# Patient Record
Sex: Female | Born: 1935 | Hispanic: No | Smoking: Never smoker
Health system: Southern US, Community
[De-identification: ages and names within clinical notes are randomized; demographics above are authoritative.]

## PROBLEM LIST (undated history)

## (undated) DIAGNOSIS — N183 Chronic kidney disease, stage 3 unspecified: Secondary | ICD-10-CM

## (undated) DIAGNOSIS — M8580 Other specified disorders of bone density and structure, unspecified site: Secondary | ICD-10-CM

## (undated) DIAGNOSIS — I10 Essential (primary) hypertension: Secondary | ICD-10-CM

## (undated) DIAGNOSIS — Z8673 Personal history of transient ischemic attack (TIA), and cerebral infarction without residual deficits: Secondary | ICD-10-CM

## (undated) DIAGNOSIS — G4700 Insomnia, unspecified: Secondary | ICD-10-CM

## (undated) DIAGNOSIS — E7800 Pure hypercholesterolemia, unspecified: Secondary | ICD-10-CM

## (undated) HISTORY — DX: Chronic kidney disease, stage 3 (moderate): N18.3

## (undated) HISTORY — DX: Insomnia, unspecified: G47.00

## (undated) HISTORY — DX: Personal history of transient ischemic attack (TIA), and cerebral infarction without residual deficits: Z86.73

## (undated) HISTORY — DX: Pure hypercholesterolemia, unspecified: E78.00

## (undated) HISTORY — DX: Other specified disorders of bone density and structure, unspecified site: M85.80

## (undated) HISTORY — DX: Chronic kidney disease, stage 3 unspecified: N18.30

## (undated) HISTORY — DX: Essential (primary) hypertension: I10

---

## 1999-04-20 ENCOUNTER — Other Ambulatory Visit: Admission: RE | Admit: 1999-04-20 | Discharge: 1999-04-20 | Payer: Self-pay | Admitting: Family Medicine

## 2001-06-09 ENCOUNTER — Other Ambulatory Visit: Admission: RE | Admit: 2001-06-09 | Discharge: 2001-06-09 | Payer: Self-pay | Admitting: Family Medicine

## 2003-06-15 ENCOUNTER — Other Ambulatory Visit: Admission: RE | Admit: 2003-06-15 | Discharge: 2003-06-15 | Payer: Self-pay | Admitting: Family Medicine

## 2004-03-16 ENCOUNTER — Ambulatory Visit: Payer: Self-pay | Admitting: Gastroenterology

## 2004-07-24 ENCOUNTER — Ambulatory Visit: Payer: Self-pay | Admitting: Family Medicine

## 2005-04-21 ENCOUNTER — Emergency Department: Payer: Self-pay | Admitting: Emergency Medicine

## 2005-05-19 ENCOUNTER — Emergency Department: Payer: Self-pay | Admitting: Unknown Physician Specialty

## 2005-05-21 ENCOUNTER — Ambulatory Visit: Payer: Self-pay | Admitting: Unknown Physician Specialty

## 2005-07-16 ENCOUNTER — Ambulatory Visit: Payer: Self-pay | Admitting: Gastroenterology

## 2005-07-26 ENCOUNTER — Ambulatory Visit: Payer: Self-pay | Admitting: Family Medicine

## 2006-08-14 ENCOUNTER — Ambulatory Visit: Payer: Self-pay | Admitting: Family Medicine

## 2006-11-24 ENCOUNTER — Ambulatory Visit: Payer: Self-pay | Admitting: Family Medicine

## 2007-12-09 ENCOUNTER — Other Ambulatory Visit: Payer: Self-pay

## 2007-12-10 ENCOUNTER — Inpatient Hospital Stay: Payer: Self-pay | Admitting: Internal Medicine

## 2008-01-12 ENCOUNTER — Ambulatory Visit: Payer: Self-pay | Admitting: Family Medicine

## 2008-02-03 ENCOUNTER — Observation Stay: Payer: Self-pay | Admitting: Specialist

## 2010-12-05 ENCOUNTER — Inpatient Hospital Stay: Payer: Self-pay | Admitting: Internal Medicine

## 2011-09-09 ENCOUNTER — Ambulatory Visit: Payer: Self-pay | Admitting: Specialist

## 2012-10-17 ENCOUNTER — Observation Stay: Payer: Self-pay | Admitting: Specialist

## 2012-10-17 LAB — COMPREHENSIVE METABOLIC PANEL
Albumin: 2.7 g/dL — ABNORMAL LOW (ref 3.4–5.0)
Anion Gap: 4 — ABNORMAL LOW (ref 7–16)
BUN: 15 mg/dL (ref 7–18)
Bilirubin,Total: 0.4 mg/dL (ref 0.2–1.0)
Calcium, Total: 8.9 mg/dL (ref 8.5–10.1)
Glucose: 84 mg/dL (ref 65–99)
Osmolality: 279 (ref 275–301)
Potassium: 3.9 mmol/L (ref 3.5–5.1)
SGOT(AST): 16 U/L (ref 15–37)
SGPT (ALT): 11 U/L — ABNORMAL LOW (ref 12–78)
Sodium: 140 mmol/L (ref 136–145)
Total Protein: 6.3 g/dL — ABNORMAL LOW (ref 6.4–8.2)

## 2012-10-17 LAB — URINALYSIS, COMPLETE
Bilirubin,UR: NEGATIVE
Blood: NEGATIVE
Ketone: NEGATIVE
Leukocyte Esterase: NEGATIVE
Nitrite: NEGATIVE
RBC,UR: 1 /HPF (ref 0–5)
WBC UR: <1 /HPF (ref 0–5)

## 2012-10-17 LAB — CBC WITH DIFFERENTIAL/PLATELET
Basophil %: 0.6 %
Eosinophil #: 0.2 10*3/uL (ref 0.0–0.7)
HCT: 31.7 % — ABNORMAL LOW (ref 35.0–47.0)
HGB: 10.3 g/dL — ABNORMAL LOW (ref 12.0–16.0)
Lymphocyte %: 9.2 %
MCHC: 32.3 g/dL (ref 32.0–36.0)
MCV: 87 fL (ref 80–100)
Neutrophil %: 84.5 %
Platelet: 333 10*3/uL (ref 150–440)
RDW: 15.7 % — ABNORMAL HIGH (ref 11.5–14.5)
WBC: 10.2 10*3/uL (ref 3.6–11.0)

## 2013-01-01 ENCOUNTER — Inpatient Hospital Stay: Payer: Self-pay | Admitting: Internal Medicine

## 2013-01-01 LAB — URINALYSIS, COMPLETE
Bacteria: NONE SEEN
Bilirubin,UR: NEGATIVE
Blood: NEGATIVE
Ketone: NEGATIVE
Nitrite: NEGATIVE
Ph: 7 (ref 4.5–8.0)
Protein: NEGATIVE
Specific Gravity: 1.013 (ref 1.003–1.030)
WBC UR: <1 /HPF (ref 0–5)

## 2013-01-01 LAB — CBC WITH DIFFERENTIAL/PLATELET
Basophil #: 0.1 10*3/uL (ref 0.0–0.1)
HCT: 39.9 % (ref 35.0–47.0)
HGB: 13.4 g/dL (ref 12.0–16.0)
Lymphocyte %: 12.3 %
MCH: 28.3 pg (ref 26.0–34.0)
MCHC: 33.6 g/dL (ref 32.0–36.0)
MCV: 84 fL (ref 80–100)
Monocyte #: 0.4 x10 3/mm (ref 0.2–0.9)
Monocyte %: 3.5 %
Neutrophil %: 82.3 %
Platelet: 338 10*3/uL (ref 150–440)
WBC: 10.6 10*3/uL (ref 3.6–11.0)

## 2013-01-01 LAB — BASIC METABOLIC PANEL
Anion Gap: 4 — ABNORMAL LOW (ref 7–16)
BUN: 17 mg/dL (ref 7–18)
Chloride: 104 mmol/L (ref 98–107)
Co2: 30 mmol/L (ref 21–32)
Potassium: 3.9 mmol/L (ref 3.5–5.1)
Sodium: 138 mmol/L (ref 136–145)

## 2013-01-02 LAB — BASIC METABOLIC PANEL
Anion Gap: 5 — ABNORMAL LOW (ref 7–16)
BUN: 10 mg/dL (ref 7–18)
Chloride: 110 mmol/L — ABNORMAL HIGH (ref 98–107)
Creatinine: 0.64 mg/dL (ref 0.60–1.30)
EGFR (African American): >60
EGFR (Non-African Amer.): >60
Osmolality: 281 (ref 275–301)
Sodium: 142 mmol/L (ref 136–145)

## 2013-01-02 LAB — CBC WITH DIFFERENTIAL/PLATELET
Basophil %: 0.8 %
HGB: 11.1 g/dL — ABNORMAL LOW (ref 12.0–16.0)
Lymphocyte #: 1.2 10*3/uL (ref 1.0–3.6)
Lymphocyte %: 15.4 %
MCHC: 34 g/dL (ref 32.0–36.0)
MCV: 83 fL (ref 80–100)
Neutrophil #: 5.8 10*3/uL (ref 1.4–6.5)
Platelet: 260 10*3/uL (ref 150–440)
RBC: 3.91 10*6/uL (ref 3.80–5.20)

## 2013-01-02 LAB — CLOSTRIDIUM DIFFICILE BY PCR

## 2013-01-06 LAB — CULTURE, BLOOD (SINGLE)

## 2013-05-29 LAB — CBC
HCT: 38.6 % (ref 35.0–47.0)
HGB: 12.4 g/dL (ref 12.0–16.0)
MCH: 28.6 pg (ref 26.0–34.0)
MCHC: 32 g/dL (ref 32.0–36.0)
MCV: 89 fL (ref 80–100)
PLATELETS: 340 10*3/uL (ref 150–440)
RBC: 4.32 10*6/uL (ref 3.80–5.20)
RDW: 13.7 % (ref 11.5–14.5)
WBC: 10 10*3/uL (ref 3.6–11.0)

## 2013-05-29 LAB — URINALYSIS, COMPLETE
BILIRUBIN, UR: NEGATIVE
BLOOD: NEGATIVE
Bacteria: NONE SEEN
Hyaline Cast: 1
KETONE: NEGATIVE
LEUKOCYTE ESTERASE: NEGATIVE
NITRITE: NEGATIVE
PH: 5 (ref 4.5–8.0)
Protein: NEGATIVE
SPECIFIC GRAVITY: 1.021 (ref 1.003–1.030)
Squamous Epithelial: NONE SEEN

## 2013-05-29 LAB — COMPREHENSIVE METABOLIC PANEL
ALBUMIN: 3.3 g/dL — AB (ref 3.4–5.0)
AST: 24 U/L (ref 15–37)
Alkaline Phosphatase: 76 U/L
Anion Gap: 5 — ABNORMAL LOW (ref 7–16)
BUN: 29 mg/dL — AB (ref 7–18)
Bilirubin,Total: 0.3 mg/dL (ref 0.2–1.0)
CHLORIDE: 105 mmol/L (ref 98–107)
CO2: 25 mmol/L (ref 21–32)
Calcium, Total: 9.3 mg/dL (ref 8.5–10.1)
Creatinine: 1.09 mg/dL (ref 0.60–1.30)
EGFR (African American): 52 — ABNORMAL LOW
EGFR (Non-African Amer.): 45 — ABNORMAL LOW
GLUCOSE: 133 mg/dL — AB (ref 65–99)
OSMOLALITY: 278 (ref 275–301)
POTASSIUM: 4.8 mmol/L (ref 3.5–5.1)
SGPT (ALT): 14 U/L (ref 12–78)
Sodium: 135 mmol/L — ABNORMAL LOW (ref 136–145)
Total Protein: 7.7 g/dL (ref 6.4–8.2)

## 2013-05-29 LAB — TROPONIN I: Troponin-I: <0.02 ng/mL

## 2013-05-30 ENCOUNTER — Ambulatory Visit: Payer: Self-pay | Admitting: Neurology

## 2013-05-30 ENCOUNTER — Inpatient Hospital Stay: Payer: Self-pay | Admitting: Internal Medicine

## 2013-05-30 LAB — PROTIME-INR
INR: 1
Prothrombin Time: 13 secs (ref 11.5–14.7)

## 2013-05-31 LAB — CBC WITH DIFFERENTIAL/PLATELET
Basophil #: 0.1 10*3/uL (ref 0.0–0.1)
Basophil %: 0.7 %
EOS PCT: 1.1 %
Eosinophil #: 0.1 10*3/uL (ref 0.0–0.7)
HCT: 30.5 % — ABNORMAL LOW (ref 35.0–47.0)
HGB: 10.1 g/dL — AB (ref 12.0–16.0)
LYMPHS PCT: 13.9 %
Lymphocyte #: 1.3 10*3/uL (ref 1.0–3.6)
MCH: 29.2 pg (ref 26.0–34.0)
MCHC: 33.1 g/dL (ref 32.0–36.0)
MCV: 88 fL (ref 80–100)
MONO ABS: 0.6 x10 3/mm (ref 0.2–0.9)
MONOS PCT: 6 %
NEUTROS ABS: 7.3 10*3/uL — AB (ref 1.4–6.5)
Neutrophil %: 78.3 %
Platelet: 247 10*3/uL (ref 150–440)
RBC: 3.46 10*6/uL — ABNORMAL LOW (ref 3.80–5.20)
RDW: 14 % (ref 11.5–14.5)
WBC: 9.3 10*3/uL (ref 3.6–11.0)

## 2013-05-31 LAB — LIPID PANEL
Cholesterol: 113 mg/dL (ref 0–200)
HDL Cholesterol: 42 mg/dL (ref 40–60)
Ldl Cholesterol, Calc: 54 mg/dL (ref 0–100)
Triglycerides: 87 mg/dL (ref 0–200)
VLDL Cholesterol, Calc: 17 mg/dL (ref 5–40)

## 2013-05-31 LAB — BASIC METABOLIC PANEL
Anion Gap: 1 — ABNORMAL LOW (ref 7–16)
BUN: 14 mg/dL (ref 7–18)
CALCIUM: 8.7 mg/dL (ref 8.5–10.1)
Chloride: 107 mmol/L (ref 98–107)
Co2: 29 mmol/L (ref 21–32)
Creatinine: 0.73 mg/dL (ref 0.60–1.30)
EGFR (African American): >60
Glucose: 126 mg/dL — ABNORMAL HIGH (ref 65–99)
OSMOLALITY: 276 (ref 275–301)
POTASSIUM: 3.9 mmol/L (ref 3.5–5.1)
Sodium: 137 mmol/L (ref 136–145)

## 2013-05-31 LAB — TSH: THYROID STIMULATING HORM: 1.83 u[IU]/mL

## 2013-06-04 LAB — CULTURE, BLOOD (SINGLE)

## 2013-09-12 ENCOUNTER — Emergency Department: Payer: Self-pay | Admitting: Emergency Medicine

## 2013-09-13 LAB — URINALYSIS, COMPLETE
BILIRUBIN, UR: NEGATIVE
BLOOD: NEGATIVE
Bacteria: NONE SEEN
GLUCOSE, UR: NEGATIVE mg/dL (ref 0–75)
KETONE: NEGATIVE
Leukocyte Esterase: NEGATIVE
Nitrite: NEGATIVE
PH: 7 (ref 4.5–8.0)
Protein: NEGATIVE
SQUAMOUS EPITHELIAL: NONE SEEN
Specific Gravity: 1.012 (ref 1.003–1.030)
WBC UR: NONE SEEN /HPF (ref 0–5)

## 2013-09-13 LAB — COMPREHENSIVE METABOLIC PANEL
AST: 24 U/L (ref 15–37)
Albumin: 3.4 g/dL (ref 3.4–5.0)
Alkaline Phosphatase: 81 U/L
Anion Gap: 5 — ABNORMAL LOW (ref 7–16)
BUN: 18 mg/dL (ref 7–18)
Bilirubin,Total: 0.3 mg/dL (ref 0.2–1.0)
CALCIUM: 9.1 mg/dL (ref 8.5–10.1)
CREATININE: 0.78 mg/dL (ref 0.60–1.30)
Chloride: 105 mmol/L (ref 98–107)
Co2: 27 mmol/L (ref 21–32)
EGFR (African American): >60
Glucose: 91 mg/dL (ref 65–99)
OSMOLALITY: 275 (ref 275–301)
POTASSIUM: 3.8 mmol/L (ref 3.5–5.1)
SGPT (ALT): 10 U/L — ABNORMAL LOW (ref 12–78)
Sodium: 137 mmol/L (ref 136–145)
Total Protein: 7.5 g/dL (ref 6.4–8.2)

## 2013-09-13 LAB — CBC
HCT: 38.4 % (ref 35.0–47.0)
HGB: 12.1 g/dL (ref 12.0–16.0)
MCH: 28.7 pg (ref 26.0–34.0)
MCHC: 31.6 g/dL — ABNORMAL LOW (ref 32.0–36.0)
MCV: 91 fL (ref 80–100)
PLATELETS: 306 10*3/uL (ref 150–440)
RBC: 4.22 10*6/uL (ref 3.80–5.20)
RDW: 13.8 % (ref 11.5–14.5)
WBC: 7 10*3/uL (ref 3.6–11.0)

## 2013-09-13 LAB — TROPONIN I

## 2013-09-14 LAB — URINE CULTURE

## 2013-09-18 LAB — CULTURE, BLOOD (SINGLE)

## 2013-10-27 ENCOUNTER — Encounter: Payer: Self-pay | Admitting: Pulmonary Disease

## 2013-10-27 ENCOUNTER — Ambulatory Visit (INDEPENDENT_AMBULATORY_CARE_PROVIDER_SITE_OTHER): Payer: Medicare Other | Admitting: Pulmonary Disease

## 2013-10-27 VITALS — BP 108/60 | HR 95 | Ht 66.0 in | Wt 92.0 lb

## 2013-10-27 DIAGNOSIS — J449 Chronic obstructive pulmonary disease, unspecified: Secondary | ICD-10-CM

## 2013-10-27 DIAGNOSIS — J189 Pneumonia, unspecified organism: Secondary | ICD-10-CM

## 2013-10-27 NOTE — Assessment & Plan Note (Signed)
Apparently she has been diagnosed with COPD. This is a bit unusual since she never smoked. She did work in [employer] of the years so I suppose it could be related to that. She also has a strong family history of COPD.  Plan: - Obtain records of prior pulmonary visits as well as pulmonary function testing - She her family requests that we not perform lung function test which is reasonable

## 2013-10-27 NOTE — Progress Notes (Signed)
Subjective:    Patient ID: Alyssa Huff, female    DOB: 10/06/1920, 80 y.o.   MRN: [id number]  HPI  This is a 80 year old female who comes to our clinic today on self-referral with her family for evaluation of recurrent pneumonia. She has a past medical history significant for COPD which is diagnosed 20 years ago. Despite this diagnosis she has no history of smoking. She did work in [employer] for most of her adult life.  She is previously been followed by Dr. Meredeth IdeFleming here in town and and has undergone pulmonary function testing multiple times. She requests that we not perform that today because she doesn't like what makes her feel. She has not had tetanus in many years. She does take Symbicort on a daily basis.  She is here because she's had recurrent pneumonia in the last year. Apparently 3-4 times in the last year she's had to be treated with antibiotics for recurrent pneumonia. Apparently for the last 3 or 4 year she's had multiple hospitalizations for respiratory infections. Her family is requesting that we consider starting a low-dose daily antibiotics to stop these infections.  She does not walk very much currently because she is so weak from her most recent episode of pneumonia. Apparently she had a bad reaction to prednisone that was administered during that illness. She has had E. his oxygen off and on in the last year but she currently does not need to use it. She lives in an assisted living facility but does not receive full time nursing care at this point.  Past Medical History  Diagnosis Date  . Hypercholesteremia   . Hypertension   . Hx of completed stroke   . Chronic kidney disease (CKD), stage III (moderate)   . Insomnia   . Osteopenia      Family History  Problem Relation Age of Onset  . COPD Son   . COPD Sister     2  . COPD Brother     1  . Cancer Brother   . Cancer Mother     stomach     History   Social History  . Marital Status: Widowed    Spouse  Name: N/A    Number of Children: N/A  . Years of Education: N/A   Occupational History  . Not on file.   Social History Main Topics  . Smoking status: Never Smoker   . Smokeless tobacco: Never Used  . Alcohol Use: No  . Drug Use: Not on file  . Sexual Activity: Not on file   Other Topics Concern  . Not on file   Social History Narrative  . No narrative on file     Allergies  Allergen Reactions  . Advair Diskus [Fluticasone-Salmeterol]     Pt unsure  . Levaquin [Levofloxacin In D5w]     hallucinations  . Lipitor [Atorvastatin]     Makes pt unable to walk  . Prednisone     hallucinations     No outpatient prescriptions prior to visit.   No facility-administered medications prior to visit.      Review of Systems  Constitutional: Negative for fever and unexpected weight change.  HENT: Positive for congestion. Negative for dental problem, ear pain, nosebleeds, postnasal drip, rhinorrhea, sinus pressure, sneezing, sore throat and trouble swallowing.   Eyes: Negative for redness and itching.  Respiratory: Positive for cough, shortness of breath and wheezing. Negative for chest tightness.   Cardiovascular: Negative for palpitations and leg swelling.  Gastrointestinal: Negative for nausea and vomiting.  Genitourinary: Negative for dysuria.  Musculoskeletal: Negative for joint swelling.  Skin: Negative for rash.  Neurological: Negative for headaches.  Hematological: Does not bruise/bleed easily.  Psychiatric/Behavioral: Negative for dysphoric mood. The patient is not nervous/anxious.        Objective:   Physical Exam Filed Vitals:   10/27/13 1420  BP: 108/60  Pulse: 95  Height: 5\' 6"  (1.676 m)  Weight: 92 lb (41.731 kg)  SpO2: 95%   Gen: Cachectic and chronically ill-appearing elderly female no acute distress HEENT: NCAT, PERRL, EOMi, OP clear, neck supple without masses PULM: Crackles in bases, no wheezing, limited air movement, hyperresonant to percussion  bilaterally CV: RRR, no mgr, no JVD AB: BS+, soft, nontender, no hsm Ext: warm, no edema, no clubbing, no cyanosis Derm: Dry skin but no no rash or skin breakdown Neuro: Awake and alert, somewhat interactive but unable to answer any of my orientation questions including location or year, in fact asking her these questions makes her tearful, she does follow simple commands  Multiple chest x-rays from the last 2-3 years were reviewed. There is hyperinflation with flattened diaphragms and a suggestion of emphysema. There is also scattered ill-defined patchy opacities on each film which seemed to wax and wane     Assessment & Plan:   COPD (chronic obstructive pulmonary disease) Apparently she has been diagnosed with COPD. This is a bit unusual since she never smoked. She did work in [employer] of the years so I suppose it could be related to that. She also has a strong family history of COPD.  Plan: - Obtain records of prior pulmonary visits as well as pulmonary function testing - She her family requests that we not perform lung function test which is reasonable  Recurrent pneumonia This is really her primary problem. Her family is requesting that I start her on a daily antibiotic for this condition.  My suspicion is that she has recurrent aspiration pneumonia because of advanced dementia. Her family does not feel that she has dementia. She has never had a swallow eval.  Plan: -Modified barium swallow - At this point, no role for a daily antibiotic as that would not prevent pneumonia, if on the other hand it seems like she is having recurrent exacerbations of COPD we may consider azithromycin -Obtain records of recent chest x-rays   Updated Medication List Outpatient Encounter Prescriptions as of 10/27/2013  Medication Sig  . acetaminophen (TYLENOL) 325 MG tablet Take 650 mg by mouth 2 (two) times daily.  Marland Kitchen albuterol (PROVENTIL) (2.5 MG/3ML) 0.083% nebulizer solution Take 2.5 mg by  nebulization every 6 (six) hours as needed for wheezing or shortness of breath.  Marland Kitchen amLODipine (NORVASC) 2.5 MG tablet Take 5 mg by mouth daily.  . ARIPiprazole (ABILIFY) 2 MG tablet Take 2 mg by mouth daily.  Marland Kitchen aspirin 81 MG chewable tablet Chew 81 mg by mouth daily.  . bethanechol (URECHOLINE) 25 MG tablet Take 25 mg by mouth 3 (three) times daily.  . budesonide-formoterol (SYMBICORT) 80-4.5 MCG/ACT inhaler Inhale 2 puffs into the lungs 2 (two) times daily.  . Cholecalciferol (VITAMIN D) 2000 UNITS CAPS Take 2,000 Units by mouth daily.  . clopidogrel (PLAVIX) 75 MG tablet Take 75 mg by mouth daily with breakfast.  . fenofibrate micronized (LOFIBRA) 134 MG capsule Take 134 mg by mouth daily before breakfast.  . Melatonin 3 MG TABS Take 3 mg by mouth at bedtime and may repeat dose one time  if needed.  . metoprolol tartrate (LOPRESSOR) 25 MG tablet Take 25 mg by mouth 2 (two) times daily.  . mirtazapine (REMERON) 15 MG tablet Take 15 mg by mouth at bedtime.  . OXYGEN-HELIUM IN Inhale 2 L into the lungs. With exertion, qhs  . pantoprazole (PROTONIX) 40 MG tablet Take 40 mg by mouth daily.  . polyethylene glycol (MIRALAX / GLYCOLAX) packet Take 17 g by mouth daily.  Marland Kitchen saccharomyces boulardii (FLORASTOR) 250 MG capsule Take 250 mg by mouth 2 (two) times daily.  . traMADol (ULTRAM) 50 MG tablet Take 50 mg by mouth every 6 (six) hours as needed.

## 2013-10-27 NOTE — Assessment & Plan Note (Signed)
This is really her primary problem. Her family is requesting that I start her on a daily antibiotic for this condition.  My suspicion is that she has recurrent aspiration pneumonia because of advanced dementia. Her family does not feel that she has dementia. She has never had a swallow eval.  Plan: -Modified barium swallow - At this point, no role for a daily antibiotic as that would not prevent pneumonia, if on the other hand it seems like she is having recurrent exacerbations of COPD we may consider azithromycin -Obtain records of recent chest x-rays

## 2013-10-27 NOTE — Patient Instructions (Signed)
We will request records from Dr. Lillette Boxer office We will arrange a modified barium swallow test at [facility] We will see you back in 3-4 weeks or sooner if needed

## 2013-11-03 ENCOUNTER — Ambulatory Visit: Payer: Self-pay | Admitting: Pulmonary Disease

## 2013-11-28 ENCOUNTER — Encounter: Payer: Self-pay | Admitting: Pulmonary Disease

## 2013-12-07 ENCOUNTER — Ambulatory Visit: Payer: BLUE CROSS/BLUE SHIELD | Admitting: Pulmonary Disease

## 2013-12-08 ENCOUNTER — Encounter: Payer: Self-pay | Admitting: Pulmonary Disease

## 2013-12-25 DEATH — deceased

## 2014-09-16 NOTE — H&P (Signed)
PATIENT NAME:  Alyssa Huff, Romelia T MR#:  [id number] DATE OF BIRTH:  1920-06-15  DATE OF ADMISSION:  01/01/2013  PRIMARY CARE PROVIDER: [facility].  EMERGENCY DEPARTMENT REFERRING PHYSICIAN: Randon GoldsmithRebecca L. Lord, MD  CHIEF COMPLAINT: Abdominal pain, especially in the left lower quadrant, cough.  HISTORY OF PRESENT ILLNESS: The patient is a 81 year old white female who [facility], who actually was hospitalized here in May with hip pain and a hip hematoma. The patient's symptoms improved from that standpoint after a month. Then about 2 weeks ago, she started having abdominal pain. She reports that she has a history of having this type of pain in the past, but it has progressively gotten worse. She is unable to exactly describe this type of pain that she is having, but she states that it comes and goes. Worse with walking. She states that this is "running pain." I do not know what that means. She has no nausea or vomiting. She has had decrease in p.o. intake. The patient has also been coughing yellow productive sputum. Has not had any fevers or chest pains. She denies any urinary symptoms, frequency, urgency or hesitancy. Denies any diarrhea, nausea or vomiting.   PAST MEDICAL HISTORY: Significant for:  1.  Hypertension.  2.  History of COPD, O2 only at nighttime, 2 liters. 3.  History of previous CVA.  4.  Hyperlipidemia.  5.  Gastroesophageal reflux disease.   ALLERGIES: ADVAIR, LEVAQUIN AND LIPITOR.   SOCIAL HISTORY: No smoking. No alcohol. No illicit drug use.   FAMILY HISTORY: Father died of complications of heart disease. Mother died from cancer, likely GI-related. There is a history of COPD in her family.   CURRENT MEDICATIONS: Amlodipine 5 daily, Plavix 75 p.o. daily, CoQ10 daily, Dramamine 50 mg q.4 p.r.n., DuoNebs b.i.d., fenofibrate 134 daily, metoprolol tartrate 25 mg 1 tab p.o. b.i.d., mirtazapine 15 at bedtime, Mucinex 600 mg 1 tab p.o.  b.i.d., pravastatin 20 at bedtime, Protonix 40 daily, Robitussin 10 mL q.6 p.r.n. for cough, vitamin D3 at 2000 international units daily.   REVIEW OF SYSTEMS:    CONSTITUTIONAL: Denies any fevers. Complains of fatigue, weakness, abdominal pain. No weight loss. No weight gain.  EYES: No blurred or double vision. No pain. No redness. No inflammation.  EARS, NOSE, THROAT: No tinnitus. No ear pain. No hearing loss. No allergies, seasonal or year-round. No epistaxis. No nasal discharge. No snoring. No postnasal drip.  RESPIRATORY: Denies any cough, wheezing, hemoptysis. Has COPD.  CARDIOVASCULAR: Complete denies any chest pain, orthopnea, edema or arrhythmia.  GASTROINTESTINAL: No nausea, vomiting, diarrhea. Complains of abdominal pain. No hematemesis. No melena. No GERD. No IBS. No jaundice.  GENITOURINARY: Denies any dysuria, hematuria, renal calculus or frequency.  ENDOCRINE: Denies any polyuria, nocturia. No increase in sweating, heat or cold intolerance. HEMATOLOGIC AND LYMPHATIC: Denies any easy bruisability or bleeding.  SKIN: No acne. No rash. No changes in mole, hair or skin.  MUSCULOSKELETAL: Complains of osteoarthritis related pains. No gout. NEUROLOGIC: No numbness. Has history of CVA with speech deficits/speech difficulties. No seizures.  PSYCHIATRIC: No anxiety. No insomnia. No ADD. No OCD.   PHYSICAL EXAMINATION: VITAL SIGNS: Temperature 97.7, pulse 74, respirations 16, blood pressure 189/79, O2 sat 98%.  GENERAL: The patient is a thin Caucasian female, frail-appearing, in no acute distress.  HEENT: Head atraumatic, normocephalic. Extraocular movements intact. Pupils equally round, reactive to light and accommodation. Sclerae anicteric. No conjunctival injection. No conjunctival pallor. Oropharynx is clear without any  exudate. Nasal exam shows no drainage or ulceration. External ear exam shows no erythema.  NECK: Supple. There is no JVD. No bruits. No lymphadenopathy. No thyromegaly.   HEART: Regular rate and rhythm. She does have a II/VI systolic murmur at the right sternal border.  LUNGS: Bilateral rhonchi. No wheezing. No rales.  ABDOMEN: Soft, positive tenderness in multiple areas of the abdomen. She has good bowel sounds. No hepatosplenomegaly.  EXTREMITIES: No clubbing, cyanosis or edema.  SKIN: Moist without any rashes.  VASCULAR: Good DP, PT pulses.  PSYCHIATRIC: Not anxious or depressed.  NEUROLOGIC: Awake, alert, oriented x 3. No focal deficits. Cranial nerves II through XII grossly intact.  LYMPHATICS: No lymph nodes palpable.  PSYCHIATRIC: Not anxious or depressed.   RADIOLOGICAL DATA: CT of the (Dictation Anomaly)  shows patchy areas of increased interstitial density in both lungs, both acute and chronic, suggests bilateral interstitial-type pneumonias. Abdomen CT shows urinary bladder is moderately distended. Stomach is mildly distended.   ASSESSMENT AND PLAN: The patient is a 81 year old with abdominal pain.  1.  Abdominal pain: Unclear cause, possibly due to pneumonia, possibly due to her hip, possibly due to gastroparesis with abdomen being distended, possibly also could be related to gastroparesis. At this time, we will treat her with pain control. Treat her with intravenous antibiotics for pneumonia. Will give a Reglan trial. Will place her on proton pump inhibitor twice daily. 2.  Possible pneumonia: We will treat with intravenous ceftriaxone and azithromycin.  3.  Chronic obstructive pulmonary disease with no evidence of acute exacerbation: Continue nebulizers twice daily. 4.  Gastroesophageal reflux disease: We will place her on intravenous Protonix twice daily. 5.  Cerebrovascular accident:  We will continue Plavix as taking at home. 6.  Hyperlipidemia: We will continue fenofibrate and Pravachol.  7.  Miscellaneous: The patient will be on heparin q.8.   TIME SPENT: 50 minutes spent on H and P on this patient.      ____________________________ Lacie Scotts. Allena Katz, MD shp:jm D: 01/01/2013 18:25:55 ET T: 01/01/2013 19:28:27 ET JOB#: [id number]  cc: Azaryah Oleksy H. Allena Katz, MD, <Dictator> Charise Carwin MD ELECTRONICALLY SIGNED 01/04/2013 10:35 Charise Carwin MD ELECTRONICALLY SIGNED 01/04/2013 10:35

## 2014-09-16 NOTE — Discharge Summary (Signed)
PATIENT NAME:  Alyssa Huff, Alyssa Huff MR#:  [id number] DATE OF BIRTH:  1920-11-16  DATE OF ADMISSION:  10/17/2012 DATE OF DISCHARGE:  10/19/2012  For a detailed note, please see the history and physical done on admission.   DIAGNOSES AT DISCHARGE: As follows: Status post fall and right hip hematoma, chronic obstructive pulmonary disease, hypertension, hyperlipidemia, history of previous cerebrovascular accident.   The patient is being discharged on a low sodium, low fat diet.   ACTIVITY: As tolerated. The patient is being discharged home with home health physical therapy and nursing services.   [facility] which is where she resides.   DISCHARGE MEDICATIONS: Are as follows: Metoprolol tartrate 25 mg b.i.d., Remeron  15 mg at bedtime, Pravachol 20 mg at bedtime, amlodipine 5 mg daily, DuoNebs b.i.d. as needed, Mucinex 600 mg b.i.d., vitamin D3 2000 international units daily, coenzyme Q 200 mg daily, Protonix 40 mg daily, Plavix 75 mg, fenofibrate 134 mg daily, Tylenol 650 q.4 hours as needed, Tylenol with hydrocodone 5/325, 1 tablet q. 4 h. as needed for pain.   PERTINENT STUDIES DONE DURING THE HOSPITAL COURSE: Are as follows: A CT scan of the hip done on May 24th with contrast showing no hip fracture, a large area of hyperdensity in the right gluteus maximus and right gluteus medius, representing a hematoma.   An x-ray of the right hip and also AP pelvis showing no acute fracture.   HOSPITAL COURSE: This is a 81 year old female who presented to the hospital with a mechanical fall, and noted to have significant right hip pain.   1.  Right hip pain/hematoma: This was as a result of a fall she suffered at  assisted living facility. She was noted to have a significantly large right-sided hip hematoma, which was causing a lot of pain and she was having difficulty ambulating. She was, therefore, brought into the hospital under observation. She was given IV  morphine for pain control. A physical therapy evaluation was also obtained. The swelling and the redness in the right hip have significantly improved. Her x-ray and her CT scan did not show any evidence of acute fracture. She evaluated by physical therapy, and they recommended home health therapy, which is being arranged for her prior to discharge. She has been taken off IV morphine and is currently taking just p.r.n. Norco for pain control, which is what she is being discharged on.  2.  COPD, with recent acute bronchitis: The patient was placed on some DuoNebs as needed, and she finished her Zithromax therapy. She has no evidence of acute COPD exacerbation.  3. GERD: The patient was maintained on Protonix. She will continue that.  4.  Hypertension: The patient was maintained on Norvasc and metoprolol. She will resume that.  5.  Hyperlipidemia: The patient was maintained on her fenofibrate and Pravachol, and she will resume that upon discharge too.   THE PATIENT IS A FULL CODE.   DISPOSITION: She is being discharged with home health nursing and physical therapy services.   Time spent: Forty minutes.    ____________________________ Rolly PancakeVivek J. Cherlynn KaiserSainani, MD vjs:dm D: 10/19/2012 15:05:25 ET Huff: 10/19/2012 15:27:59 ET JOB#: [id number]  cc: Rolly PancakeVivek J. Cherlynn KaiserSainani, MD, <Dictator> Houston SirenVIVEK J Tonee Silverstein MD ELECTRONICALLY SIGNED 11/01/2012 20:28

## 2014-09-16 NOTE — Discharge Summary (Signed)
PATIENT NAME:  Alyssa Huff, Alyssa Huff MR#:  [id number] DATE OF BIRTH:  Jun 07, 1920  DATE OF ADMISSION:  01/01/2013 DATE OF DISCHARGE:  01/04/2013  DISCHARGE DIAGNOSES: 1.  Abdominal pain, likely due to urinary retention, now resolved.  2.  Pneumonia, on antibiotics, improving.  3.  Weakness, will need physical therapy while at facility.   SECONDARY DIAGNOSES: 1.  Hypertension.  2.  Chronic obstructive pulmonary disease on 2 liters oxygen at night.  3.  History of cerebrovascular accident.  4.  Hyperlipidemia.  5.  Gastroesophageal reflux disease.   CONSULTATION: Physical Therapy.   LABORATORY AND DIAGNOSTIC DATA  Abdominal ultrasound on 08/10 showed no acute pathology. Mid pole cyst in the right kidney.  Chest x-ray on 08/08 showed COPD with chronic lung disease. Possible fibrosis or chronic atelectasis. CT scan of the chest, abdomen and pelvis with contrast on 08/08 showed bilateral pneumonia. Stable borderline enlarged mediastinal lymph nodes. No pleural or pericardial effusion. No acute abnormality in the abdomen or pelvis. No evidence of bowel obstruction or ileus or diverticulitis. Moderately distended urinary bladder.  Urinalysis on admission was negative.  Blood cultures x 2 were negative on 08/08.  Stool for C. difficile was negative.   HISTORY AND SHORT HOSPITAL COURSE: The patient is a 81 year old female with the above-mentioned medical problems who was admitted for abdominal pain. She was also found to have pneumonia. Please see Dr. Serita GritShreyang Patel's dictated history and physical for further details. The patient's abdominal pain was thought to be due to bladder distention from urinary issue for which Foley catheter was placed, and her pain was much improved. The patient was started on Urecholine to stimulate her urination which seemed to help her. She is feeling much better and is being discharged back to her assisted living facility in stable condition.   PERTINENT PHYSICAL EXAMINATION  ON THE DATE OF DISCHARGE:  VITAL SIGNS: Temperature 98.2, heart rate 69 per minute, respirations 20 per minute, blood pressure 118/68 mmHg.  She was saturating 92% on room air.  CARDIOVASCULAR: S1, S2 normal. No murmurs, rubs or gallop.  LUNGS: Clear to auscultation bilaterally. No wheezing, rales, rhonchi or crepitation.  ABDOMEN: Soft, benign.  NEUROLOGIC: Nonfocal examination.  All other physical examination remained at baseline.   DISCHARGE MEDICATIONS: 1.  Pravastatin 20 mg p.o. at bedtime.  2.  DuoNeb inhaled twice a day.  3.  Protonix 40 mg p.o. daily.  4.  Fenofibrate 134 mg 1 capsule p.o. daily.  5.  Plavix 75 mg p.o. daily.  6.  Amlodipine 5 mg p.o. daily.  7.  Coenzyme Q 200 mg p.o. daily.  8.  Vitamin D3 2000 international units once daily.  9.  Metoprolol 25 mg p.o. b.i.d.  10.  Mucinex 600 mg p.o. b.i.d.  11.  Mirtazapine 15 mg p.o. at bedtime.  12.  Dramamine 50 mg p.o. every 4 hours as needed. 13.  Robitussin 10 mL p.o. every 6 hours as needed.  14.  Bethanechol 25 mg p.o. 3 times a day.   DISCHARGE DIET: Augmentin 1 tablet p.o. b.i.d. for 5 days.   DISCHARGE DIET: Low sodium.   DISCHARGE ACTIVITY: As tolerated.   DISCHARGE INSTRUCTIONS AND FOLLOWUP:  The patient was instructed to follow up with her primary care physicians, [facility], in 1 to 2 weeks. They will be seeing her this coming Friday on 08/15 as her scheduled appointment. She was set up to get Home Health physical therapy, occupational therapy and nurse by Care Management.  TOTAL TIME DISCHARGING THIS PATIENT: 55 minutes.   ____________________________ Ellamae Sia. Sherryll Burger, MD vss:cb D: 01/04/2013 15:48:24 ET Huff: 01/04/2013 16:06:47 ET JOB#: [id number]  cc: Noelle Hoogland S. Sherryll Burger, MD, <Dictator> [facility] Ellamae Sia [facility] MD ELECTRONICALLY SIGNED 01/04/2013 17:55

## 2014-09-16 NOTE — H&P (Signed)
PATIENT NAME:  Alyssa Huff, Allis T MR#:  [id number] DATE OF BIRTH:  1921/04/11  DATE OF ADMISSION:  10/17/2012  PRIMARY CARE PHYSICIAN: [facility].   CHIEF COMPLAINT: Fall and right hip pain.   HISTORY OF PRESENT ILLNESS: This is a 81 year old female who [facility]. Apparently, had a fall late last night around 1:00 in the morning. The patient slipped over some wet floor. She then crawled herself to the hallway and got attention. She was having significant right hip pain, and therefore was sent over to the ER for further evaluation. In the Emergency Room, the patient underwent a CT scan of the right hip and also x-ray which showed no acute fracture, but a significant hematoma on the right hip. The patient was having difficulty bearing any weight on that right leg and difficulty walking. Hospitalist services were contacted for further treatment and evaluation. The patient has received almost 6 mg of morphine here in the ER, with some improvement in her pain, but it recurs back as the pain medication wears off. She is being admitted to the hospital for pain management and physical therapy evaluation. The patient denied any prodromal symptoms prior to her fall, like any chest pain, palpitation, shortness of breath, nausea, vomiting, any true syncope.   REVIEW OF SYSTEMS:  CONSTITUTIONAL: No documented fever. No weight gain, no weight loss.  EYES: No blurry or double vision.  ENT: No tinnitus. No postnasal drip. No redness of the oropharynx.  RESPIRATORY: No cough, no wheeze, no hemoptysis. Positive dyspnea.  CARDIOVASCULAR: No chest pain, no orthopnea, no palpitations, no syncope.  GASTROINTESTINAL: No nausea, no vomiting, no diarrhea. No abdominal pain. No melena or hematochezia.  GENITOURINARY: No dysuria, no hematuria.  ENDOCRINE: No polyuria or nocturia. No heat or cold intolerance.  HEMATOLOGIC: No anemia. No bruising. No bleeding.  INTEGUMENTARY: No  rashes. No lesions.  MUSCULOSKELETAL: No arthritis. No swelling. No gout.  NEUROLOGIC: No numbness or tingling. No ataxia. No seizure-type activity.  PSYCHIATRIC: No anxiety. No insomnia. No ADD.   PAST MEDICAL HISTORY: Consistent with hypertension, COPD, oxygen dependent, with history of previous CVA, hyperlipidemia, GERD.   ALLERGIES: ADVAIR, LEVAQUIN AND LIPITOR.   SOCIAL HISTORY: No history of smoking. No alcohol abuse. No illicit drug abuse. Currently resides a [facility].   FAMILY HISTORY: Father died from complications of heart disease. Mother died from cancer, likely a GI-related cancer. There is a strong history of COPD in her family.   CURRENT MEDICATIONS: As follows: 1. Plavix 75 mg daily.  2. Amlodipine 5 mg daily.  3. Zithromax 250 mg daily.  4. Coenzyme Q 200 mg daily. 5. DuoNebs b.i.d. 6. Fenofibrate 134 mg daily.  7. Metoprolol 25 mg b.i.d. 8. Mucinex 600 mg b.i.d. 9. Pravachol 20 mg daily. 10. Protonix 40 mg daily. 11. Remeron 15 mg at bedtime. 12. Tylenol 650 q.4 hours as needed.  13. Vitamin D3 2000 international units daily.   PHYSICAL EXAMINATION ON ADMISSION: As follows: VITAL SIGNS: Noted to be: Temperature is 97.9, pulse 75, respirations 18, blood pressure 169/67, sat is 97% on room air.  GENERAL: The patient is a pleasant-appearing female in no apparent distress.  HEAD, EYES, EARS, NOSE AND THROAT: Atraumatic, normocephalic. Extraocular muscles are intact. Pupils equal and reactive like to light. Sclerae anicteric. No conjunctival injection. No pharyngeal erythema.  NECK: Supple. There is no jugular venous distention. No bruits. No lymphadenopathy. No thyromegaly.  HEART: Regular rate and rhythm. She does have  a 2/6 systolic ejection murmur heard at the right sternal border. No rubs, no clicks.  LUNGS: Clear to auscultation bilaterally. No rales, no rhonchi, no wheezes.  ABDOMEN: Soft, flat, nontender, nondistended. Has good bowel  sounds. No hepatosplenomegaly appreciated.  EXTREMITIES: No evidence of any cyanosis, clubbing or peripheral edema. Has +2 pedal and radial pulses bilaterally. The patient is noted to have a significant right-sided hip hematoma with bruising around it.  SKIN: Moist and warm with no rashes.  LYMPHATIC: There is no cervical or axillary lymphadenopathy.  NEUROLOGICAL: The patient is alert, awake and oriented x3, with no focal motor or sensory deficits appreciated bilaterally.   LABORATORY DATA: Shows serum glucose of 84, BUN 15, creatinine 0.8, sodium 140, potassium 3.9, chloride 108, bicarbonate 28. The patient's LFTs are within normal limits. CBC is still pending. Urinalysis is within normal limits.   IMAGING: The patient did have an x-ray of the right hip which shows no acute fracture, an x-ray of the pelvis showing no acute fracture and a CT scan of the right hip showing no hip fracture, but a large area of hyperdensity in the right gluteus maximus and right gluteus medius may represent a hematoma.   ASSESSMENT AND PLAN: This is a 80 year old female with a history of chronic obstructive pulmonary disease, hypertension, history of previous CVA, hyperlipidemia and gastroesophageal reflux disease, who presents to the hospital after a fall at [facility] and noted to have a right hip hematoma/contusion. The patient has significant pain in the hip and is unable to ambulate presently.   1. Right hip pain/hematoma. Likely the cause of her pain and difficulty ambulating. Her CT and an her x-ray of the hip do not show any evidence of acute fracture. I will admit her for IV pain control with p.r.n. morphine. Will get a physical therapy evaluation to assess her mobility and follow her clinically.  2. Chronic obstructive pulmonary disease. No evidence of acute chronic obstructive pulmonary disease exacerbation. Continue p.r.n. DuoNebs. Continue Zithromax.  3. Gastroesophageal reflux disease. Continue Protonix.   4. Hypertension. Continue Norvasc, continue metoprolol. 5. Hyperlipidemia. Continue fenofibrate and continue Pravachol.  6. History of previous cerebrovascular accident. The patient is on Plavix. I will resume her Plavix once her CBC is back and make sure her hemoglobin is currently stable as she does have a hematoma.   CODE STATUS: The patient is a full code.   The plan was discussed with the patient and daughter, and they were in agreement.   TIME SPENT ON ADMISSION: 50 minutes.   ____________________________ Rolly Pancake. Cherlynn Kaiser, MD vjs:OSi D: 10/17/2012 09:17:25 ET T: 10/17/2012 09:37:35 ET JOB#: [id number]  cc: Rolly Pancake. Cherlynn Kaiser, MD, <Dictator> Houston Siren MD ELECTRONICALLY SIGNED 11/01/2012 20:27

## 2014-09-17 NOTE — Consult Note (Signed)
Reason for Consult: Admit Date: 30-May-2013  Chief Complaint: "I was having a terrible time talking but I guess it's a little better"  Reason for Consult: CVA   History of Present Illness: History of Present Illness:   Alyssa Huff is a 81 year old right-handed female with PMH notable for HTN, COPD, and remote stroke with residual expressive aphasia who presented to [facility] with expressive language difficulties. According to the patient and her son, she has had a stroke 6-8 years ago that caused expressive language difficulties and subsequently improved with speech therapy, though not back to baseline. Since that time, she intermittently develops worsening of th expressive aphasia, usually in the context of illness or emotional stress. Last night, gradually over a period of several hours she again noticed worsening of her language, to the point where she was nearly mute and barely able to speak at all, which is the worst her symptoms have ever been. She was admitted to [facility] for further workup, where initial head CT was normal. Overnight, her symptoms have gradually improved and she feels that she is much better, though still slightly worse than her baseline speech. She denies any numbness, weakness, visual changes, dysarthria, dysphagia, tinnitus, vertigo, or headache.  ROS:  Review of Systems   As per HPI. In addition, the patient denies fevers, chills, visual changes, chest discomfort, palpitations, cough, shortness of breath, nausea, vomiting, diarrhea, joint tenderness, or rashes. The remainder of a full 12-point review of systems is negative.   Past Medical/Surgical Hx:  cva:   Hypertension:   COPD:   Diverticulitis:   Home Medications: Medication Instructions Last Modified Date/Time  amlodipine 5 mg oral tablet 1 tab(s) orally once a day (in the morning) (8 am) 04-Jan-15 03:46  Co Q-10 200 milligram(s) orally once a day (in the morning) (8 am) 04-Jan-15 03:46  Vitamin D3 2000 intl units  oral capsule 1 cap(s) orally once a day (in the morning) (8 am) 04-Jan-15 03:46  metoprolol tartrate 25 mg oral tablet 1 tab(s) orally 2 times a day (8 am, 8 pm) 04-Jan-15 03:46  mirtazapine 15 mg oral tablet 1 tab(s) orally once a day (at bedtime) (8 pm) 04-Jan-15 03:46  pravastatin 20 mg oral tablet 1 tab(s) orally once a day (at bedtime) (8 pm) 04-Jan-15 03:46  DuoNeb 2.5 mg-0.5 mg/3 mL inhalation solution 3 milliliter(s) inhaled 2 times a day for shortness of breath (8 am, 6 pm) 04-Jan-15 03:46  Protonix 40 mg oral delayed release tablet 1 tab(s) orally once a day (8 am) 04-Jan-15 03:46  fenofibrate 134 mg oral capsule 1 cap(s) orally once a day (8 am) 04-Jan-15 03:46  clopidogrel 75 mg oral tablet 1 tab(s) orally once a day (in the morning) (8 am) 04-Jan-15 03:46  traMADol 50 mg oral tablet 1 tab(s) orally every 4 hours 04-Jan-15 03:46  guaiFENesin 600 mg oral tablet, extended release 1 tab(s) orally every 12 hours 04-Jan-15 03:46  Florastor 250 mg oral capsule 1 cap(s) orally 2 times a day 04-Jan-15 03:46  Tylenol 500 mg oral tablet 2 tab(s) orally every 6 hours 04-Jan-15 03:46  hydrOXYzine pamoate 25 mg oral capsule 1 cap(s) orally 4 times a day 04-Jan-15 03:46  Ceftin 250 mg oral tablet 1 tab(s) orally 2 times a day 04-Jan-15 03:46  predniSONE 5 mg oral tablet 6 tab(s) orally once a day for first 4 days 04-Jan-15 03:46  predniSONE 5 mg oral tablet 4 tab(s) orally once a day for the next 4 days  04-Jan-15 03:46  predniSONE 5 mg oral tablet 2 tab(s) orally once a day for the next for days 04-Jan-15 03:46   Allergies:  Lipitor: Alt Ment Status, Hallucinations  Levaquin: Alt Ment Status  Advair HFA: Unknown  Social/Family History: Living Arrangements: assisted living  Social History: Denies current smoking, EtOH use, or illicit drug use.  Family History: Positive for CAD and COPD. Negative for stroke   Vital Signs: **Vital Signs.:   04-Jan-15 14:53  Vital Signs Type Upon Transfer   Temperature Temperature (F) 97.6  Celsius 36.4  Temperature Source oral  Pulse Pulse 87  Respirations Respirations 20  Systolic BP Systolic BP 338  Diastolic BP (mmHg) Diastolic BP (mmHg) 51  Mean BP 68  Pulse Ox % Pulse Ox % 94  Pulse Ox Activity Level  At rest  Oxygen Delivery 2L   EXAM: Well-developed, well-nourished, in NAD. No conjunctival injection or scleral edema. Oropharynx clear. Normal S1, S2 and regular cardiac rhythm on exam. Occasional end-expiratory wheezes on lung auscultation bilaterally. Abdomen soft and nontender. Peripheral pulses palpated. No clubbing, cyanosis, or edema in extremities.  NEUROLOGIC EXAM  MENTAL STATUS: Alert and oriented to person, place, and time. Language appropriate with slightly impaired fluency and impaired repetition. Naming is normal. She has good comprehension and follows commands well. CRANIAL NERVES: Visual fields full to confrontation. PERRL. EOMI. Facial sensation intact. Facial muscles full and symmetric. Hearing intact to finger rub. Uvula midline with symmetric palatal elevation. Tongue midline without fasciculations. MOTOR: Normal bulk and tone. There is subtle right arm pronator drift and satelliting of the right arm suggestive of upper motor neuron weakness. Right triceps and wrist extensor strength 4+/5, otherwise strength 5/5 throughout the arms bilaterally. Strength 5/5 in iliopsoas, glutes, hamstrings, quads, and tib ant bilaterally. REFLEXES: 2+ in biceps, triceps, patella, and achilles bilaterally. Flexor plantar responses bilaterally. SENSORY: Intact to vibration and pinprick throughout. Extinction to double simultaneous stimulation is present on the right. COORDINATION: No ataxia or dysmetria on finger-nose or heel-shin maneuvers. GAIT: Not assessed.  Lab Results: LabObservation:  04-Jan-15 07:54   OBSERVATION Reason for Test  Hepatic:  03-Jan-15 22:39   Bilirubin, Total 0.3  Alkaline Phosphatase 76 (45-117 NOTE: New  Reference Range 04/16/13)  SGPT (ALT) 14  SGOT (AST) 24  Total Protein, Serum 7.7  Albumin, Serum  3.3  Routine Micro:  04-Jan-15 02:21   Micro Text Report BLOOD CULTURE   COMMENT                   NO GROWTH IN 8-12 HOURS   ANTIBIOTIC                       Culture Comment NO GROWTH IN 8-12 HOURS  Result(s) reported on 30 May 2013 at 10:00AM.  Routine Chem:  03-Jan-15 22:39   Glucose, Serum  133  BUN  29  Creatinine (comp) 1.09  Sodium, Serum  135  Potassium, Serum 4.8  Chloride, Serum 105  CO2, Serum 25  Calcium (Total), Serum 9.3  Osmolality (calc) 278  eGFR (African American)  52  eGFR (Non-African American)  45 (eGFR values <9mL/min/1.73 m2 may be an indication of chronic kidney disease (CKD). Calculated eGFR is useful in patients with stable renal function. The eGFR calculation will not be reliable in acutely ill patients when serum creatinine is changing rapidly. It is not useful in  patients on dialysis. The eGFR calculation may not be applicable to patients at the low and high extremes of body  sizes, pregnant women, and vegetarians.)  Anion Gap  5  Result Comment PT - NO SPECIMEN SENT. REORDERED  Result(s) reported on 29 May 2013 at 10:54PM.  Cardiac:  03-Jan-15 22:39   Troponin I < 0.02 (0.00-0.05 0.05 ng/mL or less: NEGATIVE  Repeat testing in 3-6 hrs  if clinically indicated. >0.05 ng/mL: POTENTIAL  MYOCARDIAL INJURY. Repeat  testing in 3-6 hrs if  clinically indicated. NOTE: An increase or decrease  of 30% or more on serial  testing suggests a  clinically important change)  Routine UA:  03-Jan-15 23:32   Color (UA) Yellow  Clarity (UA) Clear  Glucose (UA) 50 mg/dL  Bilirubin (UA) Negative  Ketones (UA) Negative  Specific Gravity (UA) 1.021  Blood (UA) Negative  pH (UA) 5.0  Protein (UA) Negative  Nitrite (UA) Negative  Leukocyte Esterase (UA) Negative (Result(s) reported on 29 May 2013 at 11:57PM.)  RBC (UA) 5 /HPF  WBC (UA) <1 /HPF   Bacteria (UA) NONE SEEN  Epithelial Cells (UA) NONE SEEN  Hyaline Cast (UA) 1 /LPF (Result(s) reported on 29 May 2013 at 11:57PM.)  Routine Coag:  04-Jan-15 02:21   Prothrombin 13.0  INR 1.0 (INR reference interval applies to patients on anticoagulant therapy. A single INR therapeutic range for coumarins is not optimal for all indications; however, the suggested range for most indications is 2.0 - 3.0. Exceptions to the INR Reference Range may include: Prosthetic heart valves, acute myocardial infarction, prevention of myocardial infarction, and combinations of aspirin and anticoagulant. The need for a higher or lower target INR must be assessed individually. Reference: The Pharmacology and Management of the Vitamin K  antagonists: the seventh ACCP Conference on Antithrombotic and Thrombolytic Therapy. AVWPV.9480 Sept:126 (3suppl): [id number]. A HCT value >55% may artifactually increase the PT.  In one study,  the increase was an average of 25%. Reference:  "Effect on Routine and Special Coagulation Testing Values of Citrate Anticoagulant Adjustment in Patients with High HCT Values." American Journal of Clinical Pathology 2006;126:400-405.)  Routine Hem:  03-Jan-15 22:39   WBC (CBC) 10.0  RBC (CBC) 4.32  Hemoglobin (CBC) 12.4  Hematocrit (CBC) 38.6  Platelet Count (CBC) 340 (Result(s) reported on 29 May 2013 at 11:03PM.)  MCV 89  MCH 28.6  MCHC 32.0  RDW 13.7   Radiology Results: [place]:    04-Jan-15 10:59, US Carotid Doppler Bilateral  US Carotid Doppler Bilateral   REASON FOR EXAM:    aphasia  COMMENTS:       PROCEDURE: [place]  - US CAROTID DOPPLER BILATERAL  - May 30 2013 10:59AM     CLINICAL DATA:  Aphasia.    EXAM:  BILATERAL CAROTID DUPLEX ULTRASOUND    TECHNIQUE:  Pearline Cables scale imaging, color Doppler and duplex ultrasound were  performed of bilateral carotid and vertebral arteries in the neck.    COMPARISON:  12/11/2007  FINDINGS:  Criteria: Quantification of  carotid stenosis is based on velocity  parameters that correlate the residual internal carotid diameter  with NASCET-based stenosis levels, using the diameter of the distal  internal carotid lumen as the denominator for stenosis measurement.    The following velocity measurements were obtained:    RIGHT    ICA:  122 cm/sec    CCA:  165 cm/sec    SYSTOLIC ICA/CCA RATIO:  1.2  DIASTOLIC ICA/CCA RATIO:  1.7    ECA:  118 cm/sec    LEFT    ICA:  127 cm/sec    CCA:  268 cm/sec  SYSTOLIC ICA/CCA RATIO:  0.5    DIASTOLIC ICA/CCA RATIO:  0.4    ECA:  115 cm/sec  RIGHT CAROTID ARTERY: Right carotid arteries are patent without  significant plaque. No hemodynamically significant stenosis in the  right internal carotid artery.    RIGHT VERTEBRAL ARTERY: Antegrade flow and normal waveform in the  right vertebral artery.    LEFT CAROTID ARTERY: There is echogenic plaque at the left carotid  bulb. Peak systolic velocity proximal to the bulb plaque is markedly  elevated, measuring 268 cm/sec. No significant stenosis in the left  internal carotid artery.    LEFT VERTEBRAL ARTERY: Antegrade flow and normal waveform in the  left vertebral artery.   IMPRESSION:  There is extensive plaque at the left carotid bulb. Hemodynamically  significant stenosis at this location cannot be excluded. This area  could be better characterized with MRA or CTA.    Estimated degree of stenosis in the right internal carotid artery is  less than 50%.    Patent vertebral arteries.      Electronically Signed    By: Markus Daft M.D.    On: 05/30/2013 12:10     Verified By: Burman Riis, M.D.,  CT:    03-Jan-15 23:02, CT Head Without Contrast  CT Head Without Contrast   REASON FOR EXAM:    altered mental status  COMMENTS:       PROCEDURE: CT  - CT HEAD WITHOUT CONTRAST  - May 29 2013 11:02PM     CLINICAL DATA:  Altered mental status.    EXAM:  CT HEAD WITHOUT  CONTRAST    TECHNIQUE:  Contiguous axial images were obtained from the base of the skull  through the vertex without intravenous contrast.    COMPARISON:  02/03/2008  FINDINGS:  There is no evidence of intracranial hemorrhage, brain edema, or  other signs of acute infarction. There is no evidence of  intracranial mass lesion or mass effect. No abnormal extraaxial  fluid collections are identified.    Mild diffuse cerebral atrophy again noted. Mild chronic small vessel  disease is also stable in appearance. No evidence hydrocephalus. No  skull abnormality identified.     IMPRESSION:  No acute intracranial findings.    Stable cerebral atrophy and chronic small vessel disease.  Electronically Signed    By: Earle Gell M.D.    On: 05/29/2013 23:42         Verified By: Marlaine Hind, M.D.,   Impression/Recommendations: Recommendations:   Alyssa Huff is a 81 yo right-handed female with vascular risk factors and a history of prior stroke who presented with expressive aphasia. This came on gradually over minutes to hours and is similar to, but more severe than, prior episodes of exacerbation of aphasia she has had in the past associated with emotional stress or infection. Her neurologic exam reveals mild expressive aphasia and impaired repetition along with mild right arm weakness in an upper motor neuron pattern. believe the slow onset and of symptoms and similar pattern to her previous symptoms makes the current presentation less likely to be a new ischemic infarct, though this cannot be ruled out based on the normal head CT. My highest concern is for recrudescence of her old symptoms in the setting of some current stressor such as a mild infection, though her labs hav been negative to this point. I would recommend getting an MRI to confirm/exclude a new stroke before considering this a failure of clopidogrel therapy. Regardless,  there is no indication for dual antiplatelet therapy in this  setting, and either ASA or clopidogrel should be used. Should a new stroke be present, I would recommend switching to ASA $Remo'81mg'mCstQ$  (no additional benefit in stroke risk reduction of ASA 325 except for increased rate of bleeding events). believe that her stroke etiology is most likely large vessel atheroembolic disease given her significant carotid disease ipsilateral to the stroke. Secondary prevention shuold focus on management of cholesterol, diabetes, and blood pressure along with single antiplatelet therapy. Brain MRI without contrast to evaluate for new strokeTTE to evaluate for cardiac causes of emboliHgbA1c and fasting lipid panel for secondary stroke risk analysisIf LDL > 100, recommend atorvastatin for secondary stroke risk reduction unless previously not well tolerated, otherwise would increase dose of current statinContinue antiplatelet therapyStart subq heparin for DVT prophylaxis - no evidence of bleeding or large ischemic infarct on CT you for the opportunity to participate in Alyssa Huff's care. Please page Neurology consults with further questions.  Electronic Signatures: Carmin Richmond (MD)  (Signed 04-Jan-15 16:57)  Authored: Consult, History of Present Illness, Review of Systems, PAST MEDICAL/SURGICAL HISTORY, HOME MEDICATIONS, ALLERGIES, Social/Family History, NURSING VITAL SIGNS, Physical Exam-, LAB RESULTS, RADIOLOGY RESULTS, Recommendations   Last Updated: 04-Jan-15 16:57 by Carmin Richmond (MD)

## 2014-09-17 NOTE — Discharge Summary (Signed)
PATIENT NAME:  Alyssa Huff, PREVITI MR#:  [id number] DATE OF BIRTH:  November 28, 1920  DATE OF ADMISSION:  05/30/2013 DATE OF DISCHARGE:  05/31/2013  ADMITTING DIAGNOSIS: Worsening expressive aphasia, flailing of her arms.  DISCHARGE DIAGNOSES: 1.  Worsening expressive aphasia, possibly could have been due to a transient ischemic attack. No evidence of cerebrovascular accident on MRI.  2.  Flailing of her arms, possibly could have been startled because she woke up from her sleep while they were checking her vitals or giving her medications. No evidence of seizure. 3.  Hypertension.  4.  History of chronic obstructive pulmonary disease, on 2 liters of oxygen at nighttime.  5.  History of previous cerebrovascular accident.  6.  Hyperlipidemia.  7.  Gastroesophageal reflux disease.  8.  Severe plaque noted on carotid Doppler.   CONSULTANTS: Neurology, Dr. Raynelle Chary.  PERTINENT LABS AND EVALUATIONS: EKG on admission showed normal sinus rhythm, possible left atrial enlargement. Glucose 133, BUN 29, creatinine 1.09, sodium 135, potassium 4.8, chloride 105, CO2 25, calcium 9.3. LFTs were normal, except albumin of 3.3. WBC 10, hemoglobin 12.4, platelet count 340. CT scan of the head showed no acute abnormality. Urinalysis was negative. Chest x-ray showed cardiomegaly with COPD changes. Blood cultures no growth. Echocardiogram of the heart showed EF of 70% to 75%, normal left ventricular systolic function, mildly dilated right atrium, mildly elevated pulmonary artery pressure, mild tricuspid regurg. Ultrasound of carotids showed left echogenic plaque at the left carotid bulb. MRI of the brain showed no acute CVA, mild to moderate age nonspecific white matter changes.   HOSPITAL COURSE: Please refer to H and P done by the admitting physician. The patient is a 81 year old white female with previous history of CVA, hypertension, COPD, hyperlipidemia, and GERD who has chronic expressive aphasia that intermittently  gets worse who was brought to the hospital because she was awoken from her sleep to give her evening medications and then she started flailing her arms in the air and she had worsening expressive aphasia. Therefore, she was brought to the ED for possible CVA. The patient was admitted and a work-up for CVA was ensued. The patient underwent a MRI of the brain, carotid Dopplers, echocardiogram. She was noted to have a plaque so certainly her symptoms could have been related to a possible TIA. She was seen by neurology who felt that her symptoms were inconsistent with acute CVA. They did not feel that she had a seizure. The patient's speech is back to baseline at this point and is currently doing stable as is stable for discharge back to her assisted living.   DISCHARGE MEDICATIONS:  1.  Pravastatin 20 mg at bedtime. 2.  DuoNebs 2 times a day as needed for shortness of breath. 3.  Protonix 40 mg daily. 4.  Fenofibrate 134 mg daily. 5.  Plavix 75 mg 1 tab p.o. daily. 6.  Amlodipine 5 mg daily. 7.  CoQ10 200 mg once a day. 8.  Vitamin D3 2000 international units daily. 9.  Metoprolol tartrate 25 mg 1 tab p.o. b.i.d. 10.  Mirtazapine 15 mg at bedtime. 11.  Tramadol 50 mg q. 4 p.r.n. for pain. 12.  Guaifenesin 600 mg 1 tab p.o. q. 12. 13.  Florastor 250 mg 1 tab p.o. b.i.d. 14.  Tylenol 1000 mg q. 6 p.r.n. 15.  Hydroxyzine 25 mg 1 tab 4 times a day as needed. 16.  Prednisone course to be finished as previously. 17.  Ceftin course to be finished as previously taking.  18.  MiraLax 17 grams daily. 19.  Aspirin 81 mg 1 tab p.o. daily.   DIET: Low-sodium, low-fat, low-cholesterol, mechanical soft, easy to chew.   ACTIVITY: As tolerated. The patient to have PT evaluation and treatment.   DISCHARGE FOLLOWUP: Follow with primary MD in 1 to 2 weeks. The patient is to have vascular Doppler of the abdomen for mesenteric artery evaluation due to recurrent abdominal pain, according to her daughter.  TIME  SPENT ON DISCHARGE: 32 minutes. ____________________________ Lacie ScottsShreyang H. Allena KatzPatel, MD shp:sb D: 06/01/2013 08:43:03 ET T: 06/01/2013 09:12:39 ET JOB#: [id number]  cc: Andee Chivers H. Allena KatzPatel, MD, <Dictator> Charise CarwinSHREYANG H Thomasena Vandenheuvel MD ELECTRONICALLY SIGNED 06/04/2013 12:43

## 2014-09-17 NOTE — H&P (Signed)
PATIENT NAME:  Alyssa Huff, Alyssa Huff MR#:  [id number] DATE OF BIRTH:  26-Sep-1920  DATE OF ADMISSION:  05/30/2013  REFERRING PHYSICIAN:  Dr. Hinda Kehr.  PRIMARY CARE PHYSICIAN:  [facility].   CHIEF COMPLAINT:  Expressive aphasia.   HISTORY OF PRESENT ILLNESS:  This is a 81 year old female with significant past medical history of hypertension, COPD on nighttime oxygen, history of hyperlipidemia, history of previous CVA in the past a few years ago with main manifestation was expressive aphasia which much improved, but still the patient has resultant at baseline mild expressive aphasia, the patient's son at bedside gives most of the history, the patient at one point today at the nursing home noticed to be having hard time talking, where she could not talk at all, where they called the son who came assisted living facility , but he reported this is not her baseline, where she was sent to the ED, initially in the ED the patient was totally aphasic upon presentation, she had CT head without contrast which did not show any acute findings, the patient was thereafter started to have some improvement of her speech where she was able to answer her name, and a few hours after the patient was able to answer a few questions, but with a hard time findings words, when the patient was asked, she denies any focal deficits, any tingling, any numbness, reports having hard time finding words, not saying words, son reports his mother sustained some degree of expressive aphasia from the previous CVA, but it was very mild and was not that extent, the patient at the nursing home was started on by mouth Ceftin and by mouth prednisone tapering dose for the last 24 hours as she was diagnosed with pneumonia, the patient's x-ray did not show any acute cardiopulmonary disease.  The patient was afebrile, did not have any significant leukocytosis, hospitalist service were requested to admit the patient for further management and  evaluation for her CVA.   PAST MEDICAL HISTORY: 1.  Hypertension.  2.  History of COPD, on 2 liters O2 at nighttime.  3.  History of previous CVA. 4.  Hyperlipidemia.  5.  Gastroesophageal reflux disease.   ALLERGIES:  1.  ADVAIR.  2.  LEVAQUIN.  3.  LIPITOR.   SOCIAL HISTORY:  The patient lives at [facility] assisted living facility.  No smoking.  No alcohol.  No illicit drug use.  Ambulates at baseline with a walker.    FAMILY HISTORY:  Significant for coronary artery disease and COPD in the family.   HOME MEDICATIONS: 1.  Norvasc 5 mg daily.  2.  Plavix 75 mg daily.  3.  Co Q-10 daily.  4.  Pravastatin 20 mg at bedtime.  5.  DuoNeb twice a day.  6.  Protonix 40 mg daily.  7.  Fenofibrate 134 mg daily.  8.  Vitamin D3 2000 international units daily.  9.  Metoprolol 25 mg by mouth twice daily. 10.  Mirtazapine 15 mg at bedtime.  11.  Bethanechol 25 mg oral three times a day. 12.  Recently started on Ceftin on December the 3rd, started on by mouth prednisone dose December the 3rd.  REVIEW OF SYSTEMS:  CONSISTUTIONAL:  The patient denies fever, chills, fatigue, weakness, weight gain, weight loss.  EYES:  Denies blurry vision, double vision, inflammation, glaucoma.  EARS, NOSE, THROAT:  Denies tinnitus, ear pain, hearing loss, allergies or epistaxis.  RESPIRATORY:  Denies cough, wheezing, hemoptysis, has COPD.  CARDIOVASCULAR:  Denies  any chest pain, orthopnea, edema, arrhythmia, palpitation.  GASTROINTESTINAL:  Denies nausea, vomiting, diarrhea, abdominal pain or GERD.  GENITOURINARY:  Denies dysuria, hematuria, renal colic.  ENDOCRINE:  Denies polyuria, polydipsia, heat or cold intolerance.  HEMATOLOGIC:  Denies any bruising or easy bleeding.  SKIN:  No rash.  No acne.  No itching.  MUSCULOSKELETAL:  Reports history of osteoarthritis.  Denies any gout or cramps.  NEUROLOGIC:  Reports expressive aphasia, describes it as it is being hard to find words, not to say words.   Denies any focal tingling, numbness, or deficits.  PSYCHIATRIC:  No anxiety.  No insomnia.  No depression.   PHYSICAL EXAMINATION:  VITAL SIGNS:  Temperature 96.7, pulse 66, respiratory rate 18, blood pressure 140/61, saturating 96% on room air.  GENERAL:  Elderly female, cachectic, looks comfortable in bed, in no apparent distress.  HEENT:  Head atraumatic, normocephalic.  Pupils equal, reactive to light.  Pink conjunctivae, anicteric sclerae, moist oral mucosa.  NECK:  Supple.  No thyromegaly.  No JVD.  No carotid bruits.  CHEST:  Good air entry bilaterally.  No wheezing, rales or rhonchi.  CARDIOVASCULAR:  S1, S2 heard.  No rubs, murmurs or gallops.  ABDOMEN:  Soft, nontender, nondistended.  Bowel sounds present.  EXTREMITIES:  No edema.  No clubbing.  No cyanosis.  Pedal pulses felt bilaterally.  PSYCHIATRIC:  The patient is awake, alert x 3.  Intact judgment and insight.  NEUROLOGIC:  The patient has expressive aphasia.  The patient is able to repeat words fluently without any deficits when asked too, but she is having a hard time answering a question.  Once asked, it takes her a few seconds and stuttering until she answers, motor 5 out of 5.  No focal deficits.  Sensation symmetrical and intact to light touch.  SKIN:  Normal skin turgor.  Warm and dry.  LYMPHATICS:  No cervical lymphadenopathy could be appreciated.   PERTINENT LABORATORY DATA:  Glucose 133, BUN 29, creatinine 1.09, sodium 135, potassium 4.8, chloride 105, CO2 25, ALT 14, AST 24, alk phos 76.  Troponin less than 0.02.  White blood cells 10, hemoglobin 12.4, hematocrit 38.6, platelets 340, INR 1.  Urinalysis negative for leukocyte esterase and nitrite.   IMAGING STUDIES:   1.  CT head without contrast, no acute intracranial findings, stable cerebral atrophy and chronic small vessel disease.  2.  Portable chest x-ray showing cardiomegaly and COPD with resolution of right upper lobe air space disease.   ASSESSMENT AND  PLAN: 1.  Cerebrovascular accident/expressive aphasia, unclear time of etiology, but it has certainly been more than three hours, appears to be gradually improving, the patient will be admitted for further cerebrovascular accident work-up.  She will be admitted to telemetry unit.  We will obtain carotid Dopplers, MRI of the brain and 2-D echocardiogram, we will consult neurology service, the patient is already on Plavix and statin, we will give her 324 mg of aspirin if she passes a swallow evaluation, we will consult speech therapy, we will have her on deep vein thrombosis prophylaxis and will allow for permissive hypertension.  2.  Pneumonia:  The patient is being treated on by mouth Ceftin.  She will be continued here on Rocephin and Zithromax.  3.  Hypertension:  We will hold all medications as we will allow for permissive hypertension due to cerebrovascular accident.  4.  History of chronic obstructive pulmonary disease, the patient has no wheezing.  We will keep her on as needed O2.  We will continue her on as needed albuterol.  5.  Hyperlipidemia.  We will continue with statin.  6.  Gastroesophageal reflux disease.  We will continue on PPI.  7.  Deep vein thrombosis prophylaxis.  Subcutaneous heparin.  8.  CODE STATUS:  Discussed with the patient and her son at bedside.  She wishes to be FULL CODE and the daughter is her healthcare power of attorney.    Total time spent on admission and patient care 55 minutes.    ____________________________ Albertine Patricia, MD dse:ea D: 05/30/2013 65:80:06 ET T: 05/30/2013 03:48:32 ET JOB#: [id number]  cc: Albertine Patricia, MD, <Dictator> Heru Montz Graciela Husbands MD ELECTRONICALLY SIGNED 05/30/2013 5:02

## 2014-12-15 ENCOUNTER — Telehealth: Payer: Self-pay | Admitting: *Deleted

## 2014-12-15 NOTE — Telephone Encounter (Signed)
Called patient to inform that Dr. Kendrick Fries will no longer be seeing patient in the [place] office. He will still be seeing patient in the [place] office. She may be seen still in the [place] office if she desires. A letter was mailed to the address we have on file but returned invalid address.

## 2015-01-02 IMAGING — US ABDOMEN ULTRASOUND
1 series · 14 of 25 positions shown · non-contrast
Comparison: none

REASON FOR EXAM: abdominal pain
COMMENTS:

[Series 1: abdomen ultrasound · 0.16mm/px · 14 of 102 slices shown]
[im 1/102]
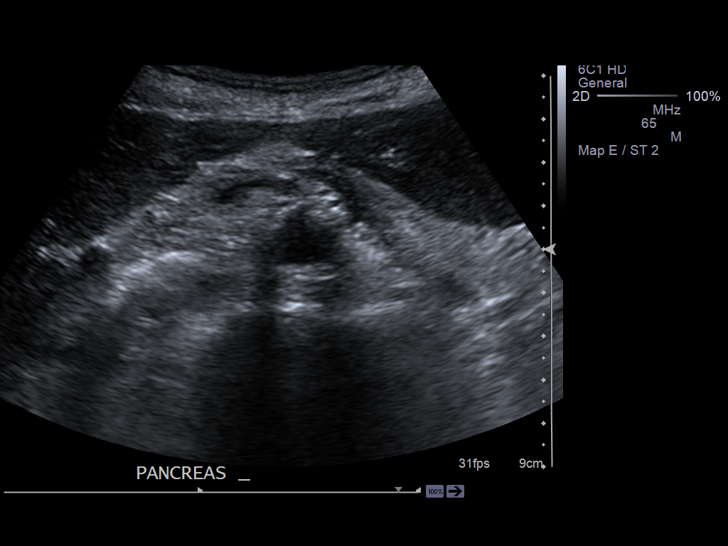
[im 9/102]
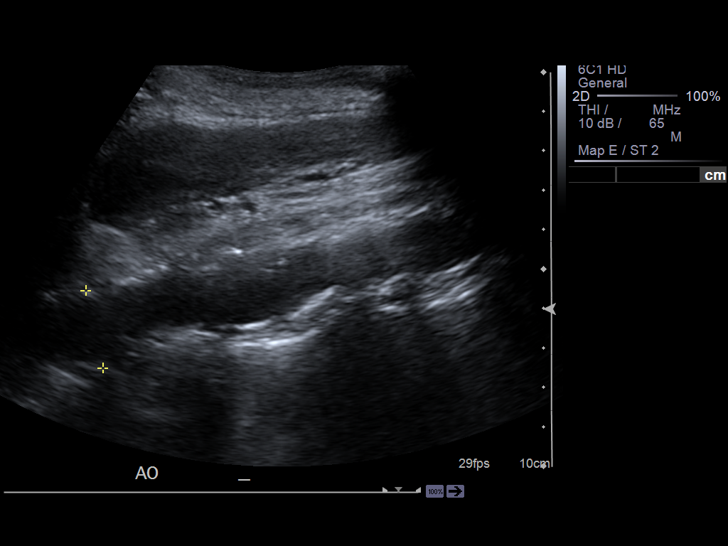
[im 17/102]
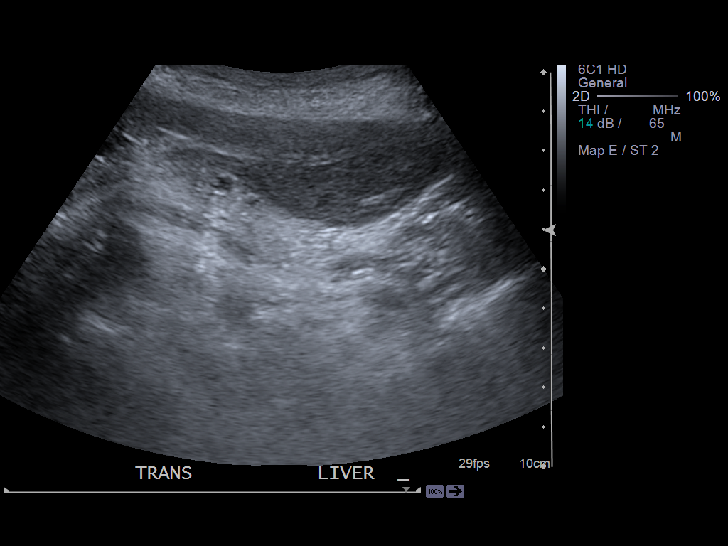
[im 26/102]
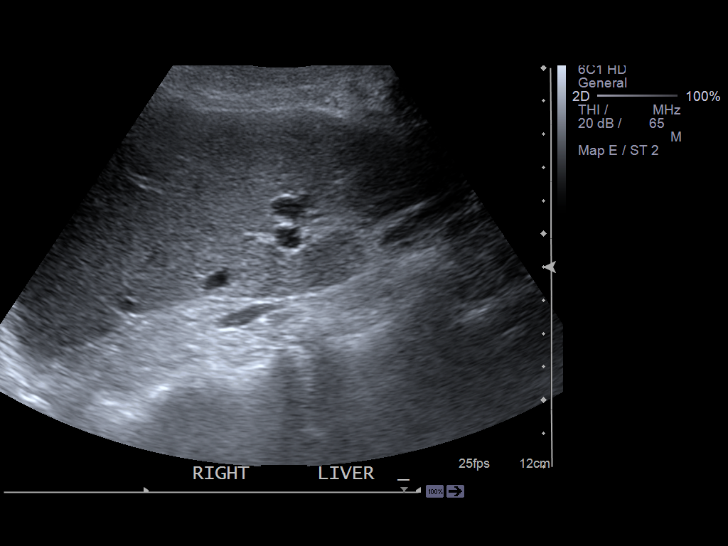
[im 34/102]
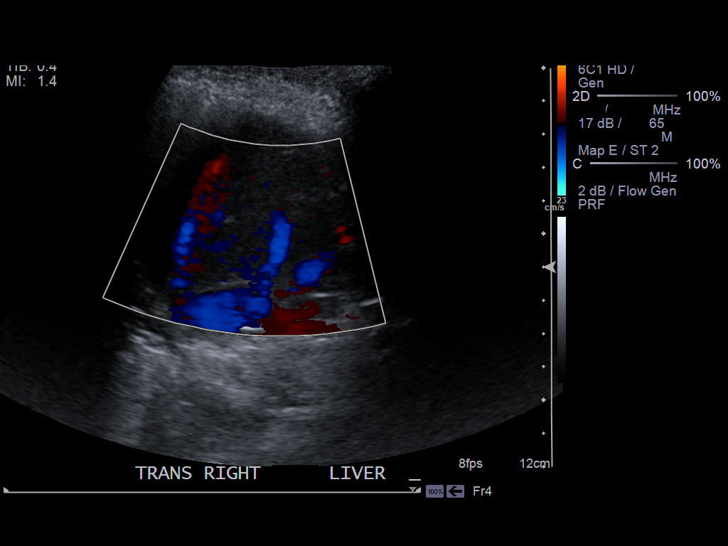
[im 38/102]
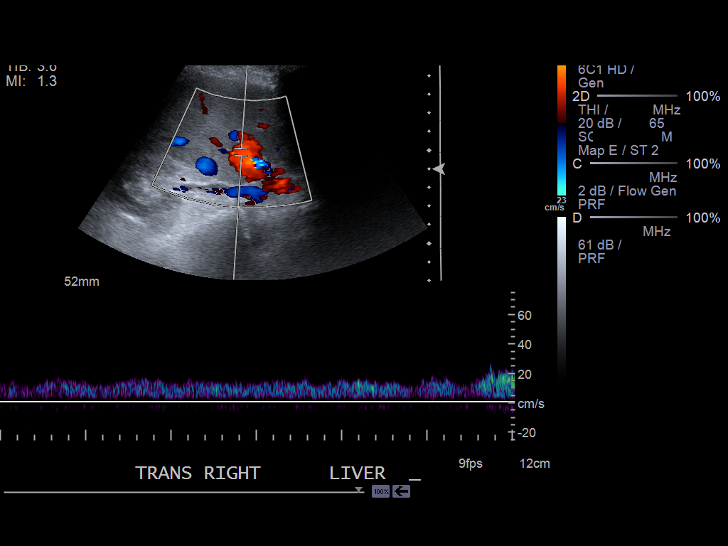
[im 47/102]
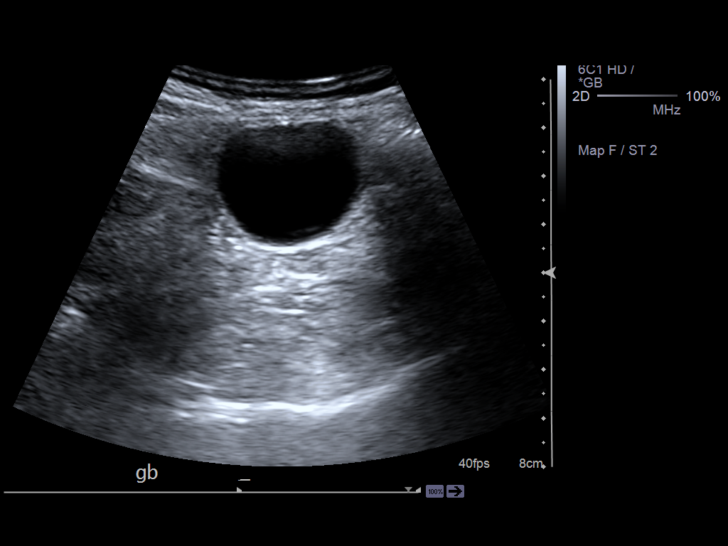
[im 55/102]
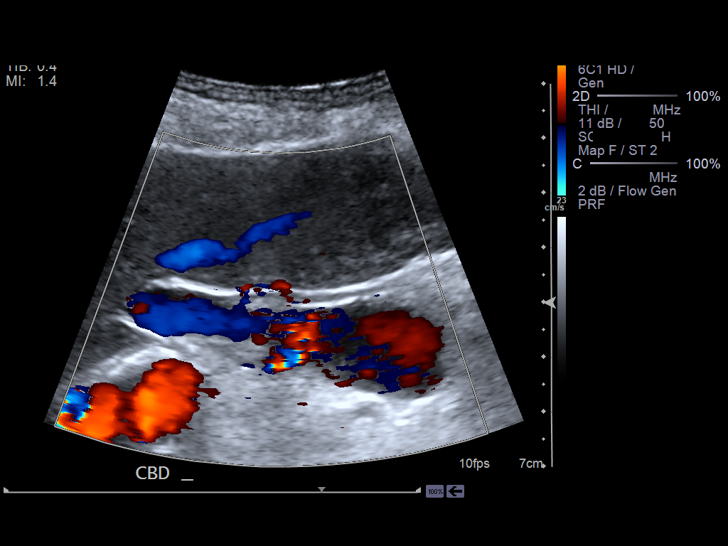
[im 64/102]
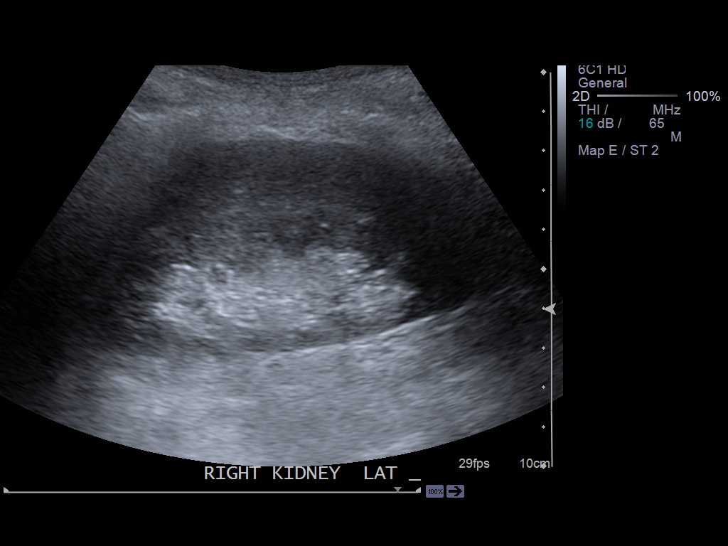
[im 68/102]
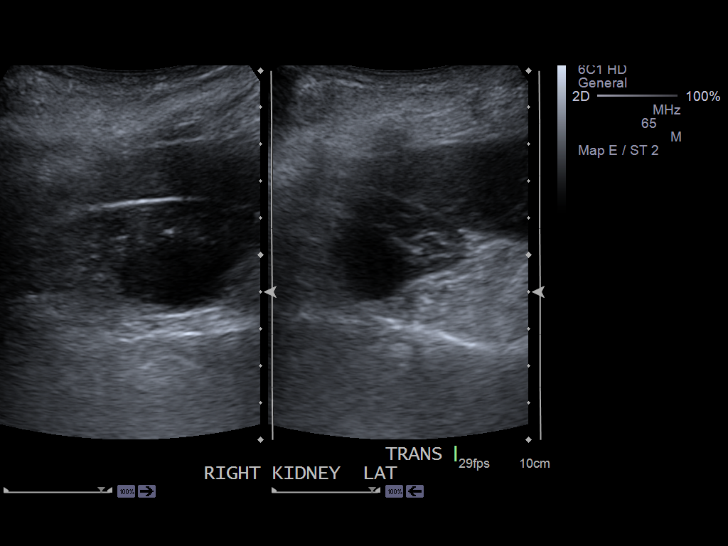
[im 76/102]
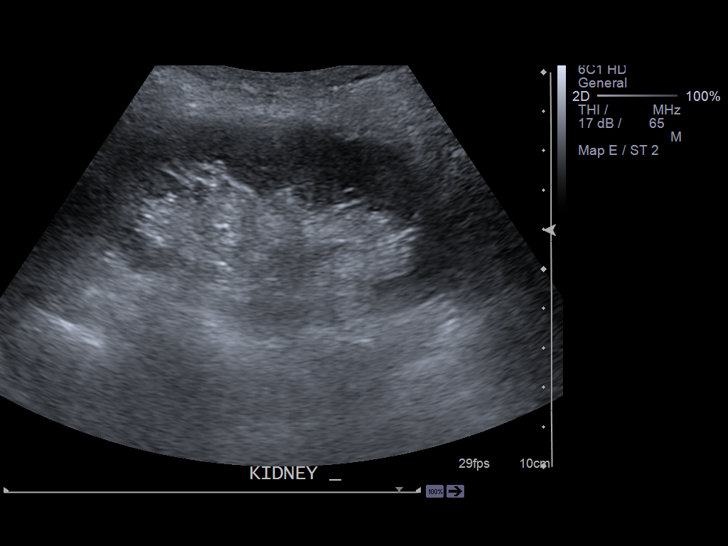
[im 85/102]
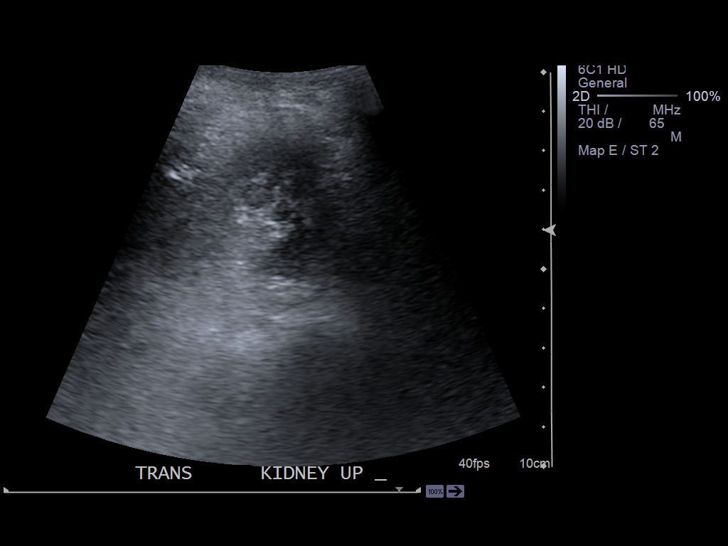
[im 93/102]
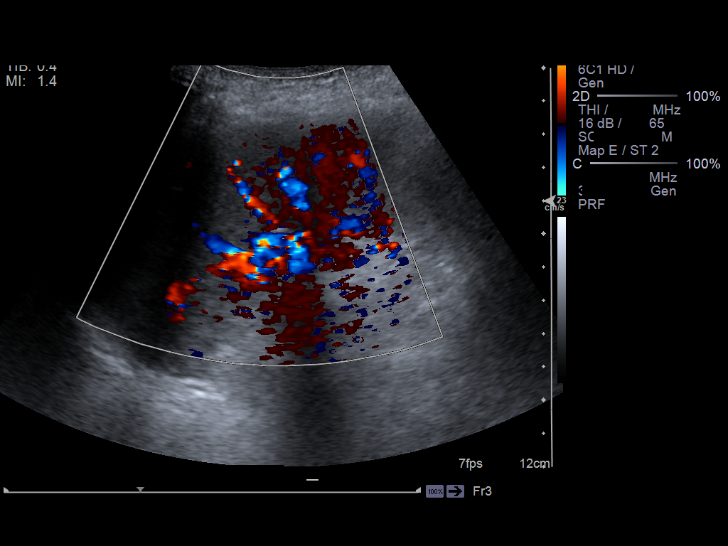
[im 102/102]
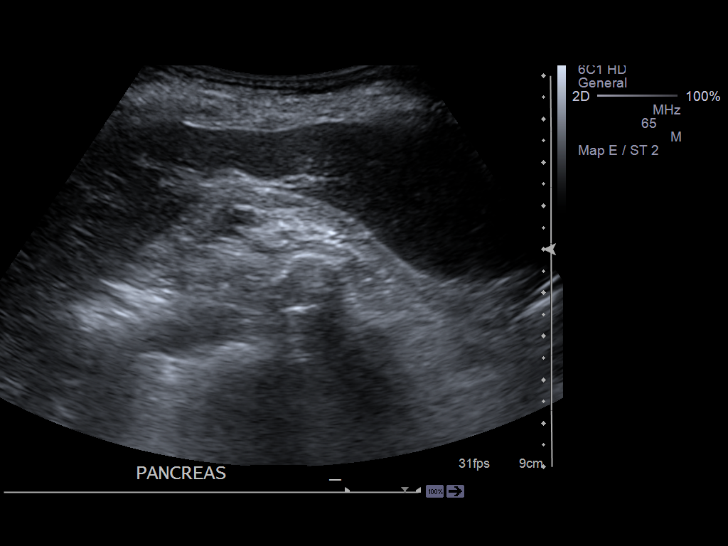

[14 of 25 positions shown; findings below may reference images not displayed]

PROCEDURE:     US  - US ABDOMEN GENERAL SURVEY  - January 03, 2013 [DATE]

RESULT:     The liver exhibits normal echotexture with no focal mass nor
ductal dilation. Portal venous flow is normal in direction toward the liver.
The observed portions of the pancreas are normal. The gallbladder is
adequately distended with no evidence of stones, wall thickening, or
pericholecystic fluid. There is no positive sonographic Murphy's sign. The
spleen is normal in echotexture and contour measures 11 cm in greatest
dimension. The common bile duct is normal measuring just under 2 mm in
diameter.

The right kidney measures 9.4 cm in length. There is a mid pole cyst
measuring 2.9 x 1.9 x 1.6 cm. There is no hydronephrosis. The left kidney
exhibits normal echotexture and measures 9.7 centimeters in length. The
observed portions of the inferior vena cava and abdominal aorta are normal.
IMPRESSION: 1. There is no evidence of gallstones nor other acute hepatobiliary
abnormality.
2. There is a midpole cyst in the right kidney.
3. The pancreas and spleen and common bile duct exhibit no acute
abnormalities.

[REDACTED]
